# Patient Record
Sex: Male | Born: 1955 | Race: White | Hispanic: No | Marital: Married | Smoking: Current every day smoker
Health system: Southern US, Community
[De-identification: ages and names within clinical notes are randomized; demographics above are authoritative.]

## PROBLEM LIST (undated history)

## (undated) DIAGNOSIS — I739 Peripheral vascular disease, unspecified: Secondary | ICD-10-CM

## (undated) DIAGNOSIS — I2510 Atherosclerotic heart disease of native coronary artery without angina pectoris: Secondary | ICD-10-CM

## (undated) DIAGNOSIS — I6381 Other cerebral infarction due to occlusion or stenosis of small artery: Secondary | ICD-10-CM

## (undated) DIAGNOSIS — I700 Atherosclerosis of aorta: Secondary | ICD-10-CM

## (undated) HISTORY — PX: LUMBAR LAMINECTOMY: SHX95

## (undated) HISTORY — DX: Atherosclerotic heart disease of native coronary artery without angina pectoris: I25.10

## (undated) HISTORY — DX: Atherosclerosis of aorta: I70.0

## (undated) HISTORY — DX: Peripheral vascular disease, unspecified: I73.9

## (undated) HISTORY — DX: Other cerebral infarction due to occlusion or stenosis of small artery: I63.81

## (undated) HISTORY — PX: KNEE ARTHROSCOPY W/ MENISCAL REPAIR: SHX1877

## (undated) HISTORY — PX: TONSILLECTOMY AND ADENOIDECTOMY: SUR1326

---

## 1998-10-29 ENCOUNTER — Inpatient Hospital Stay (HOSPITAL_COMMUNITY): Admission: EM | Admit: 1998-10-29 | Discharge: 1998-10-30 | Payer: Self-pay | Admitting: Cardiology

## 2002-08-09 HISTORY — PX: ANAL FISSURECTOMY: SUR608

## 2004-06-09 ENCOUNTER — Ambulatory Visit: Payer: Self-pay | Admitting: Family Medicine

## 2004-10-20 ENCOUNTER — Ambulatory Visit: Payer: Self-pay | Admitting: Family Medicine

## 2005-05-03 ENCOUNTER — Ambulatory Visit: Payer: Self-pay | Admitting: Family Medicine

## 2012-08-09 HISTORY — PX: CERVICAL FUSION: SHX112

## 2013-09-14 ENCOUNTER — Other Ambulatory Visit: Payer: Self-pay | Admitting: Neurosurgery

## 2013-09-14 DIAGNOSIS — M5126 Other intervertebral disc displacement, lumbar region: Secondary | ICD-10-CM

## 2013-09-18 ENCOUNTER — Ambulatory Visit
Admission: RE | Admit: 2013-09-18 | Discharge: 2013-09-18 | Disposition: A | Discharge status: Home / Self Care | Payer: BC Managed Care – PPO | Source: Ambulatory Visit | Attending: Neurosurgery | Admitting: Neurosurgery

## 2013-09-18 VITALS — BP 118/68 | HR 73

## 2013-09-18 DIAGNOSIS — M5126 Other intervertebral disc displacement, lumbar region: Secondary | ICD-10-CM

## 2013-09-18 MED ORDER — ONDANSETRON HCL 4 MG/2ML IJ SOLN: 4.0000 mg | Freq: Four times a day (QID) | INTRAMUSCULAR | Status: DC | PRN

## 2013-09-18 MED ORDER — DIAZEPAM 5 MG PO TABS
10.0000 mg | ORAL_TABLET | Freq: Once | ORAL | Status: DC
Start: 2013-09-18 — End: 2013-09-20

## 2013-09-18 MED ORDER — IOHEXOL 180 MG/ML  SOLN
15.0000 mL | Freq: Once | INTRAMUSCULAR | Status: AC | PRN
  Administered 2013-09-18: 15 mL via INTRATHECAL

## 2013-09-18 MED ORDER — DIAZEPAM 5 MG PO TABS
10.0000 mg | ORAL_TABLET | Freq: Once | ORAL | Status: AC
  Administered 2013-09-18: 10 mg via ORAL

## 2013-09-18 NOTE — Discharge Instructions (Addendum)
Myelogram Discharge Instructions  1. Go home and rest quietly for the next 24 hours.  It is important to lie flat for the next 24 hours.  Get up only to go to the restroom.  You may lie in the bed or on a couch on your back, your stomach, your left side or your right side.  You may have one pillow under your head.  You may have pillows between your knees while you are on your side or under your knees while you are on your back.  2. DO NOT drive today.  Recline the seat as far back as it will go, while still wearing your seat belt, on the way home.  3. You may get up to go to the bathroom as needed.  You may sit up for 10 minutes to eat.  You may resume your normal diet and medications unless otherwise indicated.  Drink lots of extra fluids today and tomorrow.  4. The incidence of headache, nausea, or vomiting is about 5% (one in 20 patients).  If you develop a headache, lie flat and drink plenty of fluids until the headache goes away.  Caffeinated beverages may be helpful.  If you develop severe nausea and vomiting or a headache that does not go away with flat bed rest, call [phone].  5. You may resume normal activities after your 24 hours of bed rest is over; however, do not exert yourself strongly or do any heavy lifting tomorrow. If when you get up you have a headache when standing, go back to bed and force fluids for another 24 hours.  6. Call your physician for a follow-up appointment.  The results of your myelogram will be sent directly to your physician by the following day.  7. If you have any questions or if complications develop after you arrive home, please call [phone].  Discharge instructions have been explained to the patient.  The patient, or the person responsible for the patient, fully understands these instructions.      May resume Paroxetine on Feb. 11, 2015, after 1:00 pm.

## 2013-09-18 NOTE — Progress Notes (Signed)
Pt states he has been off paroxetine for the past 2 days. Discharge instructions explained to pt.

## 2013-09-20 ENCOUNTER — Telehealth: Payer: Self-pay | Admitting: Radiology

## 2013-09-20 NOTE — Telephone Encounter (Signed)
Pt had myelo on 09/18/13, pt now has a severe positional headache. Explained blood patch and need for more bedrest. He will call back in a.m. if needs the patch. I will call dr.'s off and see about getting a blood patch order.

## 2013-09-20 NOTE — Telephone Encounter (Signed)
Called dr. De Blanch office and was left on hold for 8 minutes waiting to see about blood patch order, will try again in morning if pt wishes to pursue this.

## 2015-04-28 ENCOUNTER — Other Ambulatory Visit: Payer: Self-pay | Admitting: Orthopaedic Surgery

## 2015-04-28 DIAGNOSIS — M4716 Other spondylosis with myelopathy, lumbar region: Secondary | ICD-10-CM

## 2015-04-29 ENCOUNTER — Ambulatory Visit
Admission: RE | Admit: 2015-04-29 | Discharge: 2015-04-29 | Disposition: A | Discharge status: Home / Self Care | Payer: Self-pay | Source: Ambulatory Visit | Attending: Orthopaedic Surgery | Admitting: Orthopaedic Surgery

## 2015-04-29 DIAGNOSIS — M4716 Other spondylosis with myelopathy, lumbar region: Secondary | ICD-10-CM

## 2015-08-12 DIAGNOSIS — M4806 Spinal stenosis, lumbar region: Secondary | ICD-10-CM | POA: Diagnosis not present

## 2015-08-12 DIAGNOSIS — M961 Postlaminectomy syndrome, not elsewhere classified: Secondary | ICD-10-CM | POA: Diagnosis not present

## 2015-08-12 DIAGNOSIS — M4316 Spondylolisthesis, lumbar region: Secondary | ICD-10-CM | POA: Diagnosis not present

## 2015-08-14 DIAGNOSIS — Z885 Allergy status to narcotic agent status: Secondary | ICD-10-CM | POA: Diagnosis not present

## 2015-08-14 DIAGNOSIS — M5440 Lumbago with sciatica, unspecified side: Secondary | ICD-10-CM | POA: Diagnosis not present

## 2015-08-14 DIAGNOSIS — Z880 Allergy status to penicillin: Secondary | ICD-10-CM | POA: Diagnosis not present

## 2015-08-14 DIAGNOSIS — Z79899 Other long term (current) drug therapy: Secondary | ICD-10-CM | POA: Diagnosis not present

## 2015-08-14 DIAGNOSIS — G8929 Other chronic pain: Secondary | ICD-10-CM | POA: Diagnosis not present

## 2015-08-14 DIAGNOSIS — F329 Major depressive disorder, single episode, unspecified: Secondary | ICD-10-CM | POA: Diagnosis not present

## 2015-09-08 DIAGNOSIS — M4316 Spondylolisthesis, lumbar region: Secondary | ICD-10-CM | POA: Diagnosis not present

## 2015-09-08 DIAGNOSIS — M4806 Spinal stenosis, lumbar region: Secondary | ICD-10-CM | POA: Diagnosis not present

## 2015-09-08 DIAGNOSIS — M5106 Intervertebral disc disorders with myelopathy, lumbar region: Secondary | ICD-10-CM | POA: Diagnosis not present

## 2015-09-12 DIAGNOSIS — Z720 Tobacco use: Secondary | ICD-10-CM | POA: Diagnosis not present

## 2015-09-12 DIAGNOSIS — Z716 Tobacco abuse counseling: Secondary | ICD-10-CM | POA: Diagnosis not present

## 2015-09-17 DIAGNOSIS — M25512 Pain in left shoulder: Secondary | ICD-10-CM | POA: Diagnosis not present

## 2015-09-17 DIAGNOSIS — M7542 Impingement syndrome of left shoulder: Secondary | ICD-10-CM | POA: Diagnosis not present

## 2015-09-24 DIAGNOSIS — M5136 Other intervertebral disc degeneration, lumbar region: Secondary | ICD-10-CM | POA: Diagnosis not present

## 2015-09-24 DIAGNOSIS — M4716 Other spondylosis with myelopathy, lumbar region: Secondary | ICD-10-CM | POA: Diagnosis not present

## 2015-10-09 DIAGNOSIS — M5106 Intervertebral disc disorders with myelopathy, lumbar region: Secondary | ICD-10-CM | POA: Diagnosis not present

## 2015-10-09 DIAGNOSIS — M4716 Other spondylosis with myelopathy, lumbar region: Secondary | ICD-10-CM | POA: Diagnosis not present

## 2015-10-16 DIAGNOSIS — M4806 Spinal stenosis, lumbar region: Secondary | ICD-10-CM | POA: Diagnosis not present

## 2015-10-16 DIAGNOSIS — M4716 Other spondylosis with myelopathy, lumbar region: Secondary | ICD-10-CM | POA: Diagnosis not present

## 2015-10-16 DIAGNOSIS — M5126 Other intervertebral disc displacement, lumbar region: Secondary | ICD-10-CM | POA: Diagnosis not present

## 2015-10-31 DIAGNOSIS — M4716 Other spondylosis with myelopathy, lumbar region: Secondary | ICD-10-CM | POA: Diagnosis not present

## 2015-10-31 DIAGNOSIS — M5106 Intervertebral disc disorders with myelopathy, lumbar region: Secondary | ICD-10-CM | POA: Diagnosis not present

## 2015-12-05 DIAGNOSIS — Z01812 Encounter for preprocedural laboratory examination: Secondary | ICD-10-CM | POA: Diagnosis not present

## 2015-12-08 DIAGNOSIS — Z880 Allergy status to penicillin: Secondary | ICD-10-CM | POA: Diagnosis not present

## 2015-12-08 DIAGNOSIS — Z885 Allergy status to narcotic agent status: Secondary | ICD-10-CM | POA: Diagnosis not present

## 2015-12-08 DIAGNOSIS — F17210 Nicotine dependence, cigarettes, uncomplicated: Secondary | ICD-10-CM | POA: Diagnosis not present

## 2015-12-08 DIAGNOSIS — M545 Low back pain: Secondary | ICD-10-CM | POA: Diagnosis not present

## 2015-12-08 DIAGNOSIS — M4316 Spondylolisthesis, lumbar region: Secondary | ICD-10-CM | POA: Diagnosis not present

## 2015-12-08 DIAGNOSIS — M4806 Spinal stenosis, lumbar region: Secondary | ICD-10-CM | POA: Diagnosis not present

## 2015-12-08 DIAGNOSIS — M4716 Other spondylosis with myelopathy, lumbar region: Secondary | ICD-10-CM | POA: Diagnosis not present

## 2015-12-08 DIAGNOSIS — M961 Postlaminectomy syndrome, not elsewhere classified: Secondary | ICD-10-CM | POA: Diagnosis not present

## 2015-12-08 DIAGNOSIS — M4322 Fusion of spine, cervical region: Secondary | ICD-10-CM | POA: Diagnosis not present

## 2015-12-08 DIAGNOSIS — M4726 Other spondylosis with radiculopathy, lumbar region: Secondary | ICD-10-CM | POA: Diagnosis not present

## 2015-12-10 DIAGNOSIS — M4326 Fusion of spine, lumbar region: Secondary | ICD-10-CM | POA: Diagnosis not present

## 2016-01-07 DIAGNOSIS — M4326 Fusion of spine, lumbar region: Secondary | ICD-10-CM | POA: Diagnosis not present

## 2016-01-26 DIAGNOSIS — J432 Centrilobular emphysema: Secondary | ICD-10-CM | POA: Insufficient documentation

## 2016-01-26 DIAGNOSIS — F3340 Major depressive disorder, recurrent, in remission, unspecified: Secondary | ICD-10-CM | POA: Insufficient documentation

## 2016-01-26 DIAGNOSIS — E782 Mixed hyperlipidemia: Secondary | ICD-10-CM | POA: Insufficient documentation

## 2016-01-28 DIAGNOSIS — Z13220 Encounter for screening for lipoid disorders: Secondary | ICD-10-CM | POA: Diagnosis not present

## 2016-01-28 DIAGNOSIS — Z298 Encounter for other specified prophylactic measures: Secondary | ICD-10-CM | POA: Diagnosis not present

## 2016-01-28 DIAGNOSIS — Z125 Encounter for screening for malignant neoplasm of prostate: Secondary | ICD-10-CM | POA: Diagnosis not present

## 2016-01-28 DIAGNOSIS — Z131 Encounter for screening for diabetes mellitus: Secondary | ICD-10-CM | POA: Diagnosis not present

## 2016-01-28 DIAGNOSIS — R634 Abnormal weight loss: Secondary | ICD-10-CM | POA: Diagnosis not present

## 2016-02-23 DIAGNOSIS — Z298 Encounter for other specified prophylactic measures: Secondary | ICD-10-CM | POA: Diagnosis not present

## 2016-03-03 DIAGNOSIS — M549 Dorsalgia, unspecified: Secondary | ICD-10-CM | POA: Diagnosis not present

## 2016-04-05 DIAGNOSIS — M4326 Fusion of spine, lumbar region: Secondary | ICD-10-CM | POA: Diagnosis not present

## 2016-04-05 DIAGNOSIS — M47816 Spondylosis without myelopathy or radiculopathy, lumbar region: Secondary | ICD-10-CM | POA: Diagnosis not present

## 2016-04-05 DIAGNOSIS — Z79891 Long term (current) use of opiate analgesic: Secondary | ICD-10-CM | POA: Diagnosis not present

## 2016-04-05 DIAGNOSIS — M5136 Other intervertebral disc degeneration, lumbar region: Secondary | ICD-10-CM | POA: Diagnosis not present

## 2016-04-05 DIAGNOSIS — G894 Chronic pain syndrome: Secondary | ICD-10-CM | POA: Diagnosis not present

## 2016-04-06 DIAGNOSIS — F17200 Nicotine dependence, unspecified, uncomplicated: Secondary | ICD-10-CM | POA: Diagnosis not present

## 2016-04-06 DIAGNOSIS — Z8371 Family history of colonic polyps: Secondary | ICD-10-CM | POA: Diagnosis not present

## 2016-04-06 DIAGNOSIS — Z1211 Encounter for screening for malignant neoplasm of colon: Secondary | ICD-10-CM | POA: Diagnosis not present

## 2016-04-16 DIAGNOSIS — F3340 Major depressive disorder, recurrent, in remission, unspecified: Secondary | ICD-10-CM | POA: Diagnosis not present

## 2016-04-16 DIAGNOSIS — N529 Male erectile dysfunction, unspecified: Secondary | ICD-10-CM | POA: Diagnosis not present

## 2016-04-16 DIAGNOSIS — F17200 Nicotine dependence, unspecified, uncomplicated: Secondary | ICD-10-CM | POA: Insufficient documentation

## 2016-04-21 DIAGNOSIS — D125 Benign neoplasm of sigmoid colon: Secondary | ICD-10-CM | POA: Diagnosis not present

## 2016-04-21 DIAGNOSIS — D122 Benign neoplasm of ascending colon: Secondary | ICD-10-CM | POA: Diagnosis not present

## 2016-04-21 DIAGNOSIS — Z800 Family history of malignant neoplasm of digestive organs: Secondary | ICD-10-CM | POA: Diagnosis not present

## 2016-04-21 DIAGNOSIS — G43909 Migraine, unspecified, not intractable, without status migrainosus: Secondary | ICD-10-CM | POA: Diagnosis not present

## 2016-04-21 DIAGNOSIS — K5730 Diverticulosis of large intestine without perforation or abscess without bleeding: Secondary | ICD-10-CM | POA: Diagnosis not present

## 2016-04-21 DIAGNOSIS — Z79899 Other long term (current) drug therapy: Secondary | ICD-10-CM | POA: Diagnosis not present

## 2016-04-21 DIAGNOSIS — K635 Polyp of colon: Secondary | ICD-10-CM | POA: Diagnosis not present

## 2016-04-21 DIAGNOSIS — F17210 Nicotine dependence, cigarettes, uncomplicated: Secondary | ICD-10-CM | POA: Diagnosis not present

## 2016-04-21 DIAGNOSIS — F3340 Major depressive disorder, recurrent, in remission, unspecified: Secondary | ICD-10-CM | POA: Diagnosis not present

## 2016-04-21 DIAGNOSIS — J449 Chronic obstructive pulmonary disease, unspecified: Secondary | ICD-10-CM | POA: Diagnosis not present

## 2016-04-21 DIAGNOSIS — D123 Benign neoplasm of transverse colon: Secondary | ICD-10-CM | POA: Diagnosis not present

## 2016-04-21 DIAGNOSIS — K648 Other hemorrhoids: Secondary | ICD-10-CM | POA: Diagnosis not present

## 2016-04-21 DIAGNOSIS — K219 Gastro-esophageal reflux disease without esophagitis: Secondary | ICD-10-CM | POA: Diagnosis not present

## 2016-04-21 DIAGNOSIS — D124 Benign neoplasm of descending colon: Secondary | ICD-10-CM | POA: Diagnosis not present

## 2016-04-21 DIAGNOSIS — Z8371 Family history of colonic polyps: Secondary | ICD-10-CM | POA: Diagnosis not present

## 2016-04-21 DIAGNOSIS — Z1211 Encounter for screening for malignant neoplasm of colon: Secondary | ICD-10-CM | POA: Diagnosis not present

## 2016-05-03 DIAGNOSIS — M4326 Fusion of spine, lumbar region: Secondary | ICD-10-CM | POA: Diagnosis not present

## 2016-05-03 DIAGNOSIS — M961 Postlaminectomy syndrome, not elsewhere classified: Secondary | ICD-10-CM | POA: Diagnosis not present

## 2016-06-09 DIAGNOSIS — J018 Other acute sinusitis: Secondary | ICD-10-CM | POA: Diagnosis not present

## 2016-06-09 DIAGNOSIS — J029 Acute pharyngitis, unspecified: Secondary | ICD-10-CM | POA: Diagnosis not present

## 2016-06-16 DIAGNOSIS — M4326 Fusion of spine, lumbar region: Secondary | ICD-10-CM | POA: Diagnosis not present

## 2016-06-16 DIAGNOSIS — M5106 Intervertebral disc disorders with myelopathy, lumbar region: Secondary | ICD-10-CM | POA: Diagnosis not present

## 2016-06-16 DIAGNOSIS — M4716 Other spondylosis with myelopathy, lumbar region: Secondary | ICD-10-CM | POA: Diagnosis not present

## 2016-07-30 DIAGNOSIS — J0100 Acute maxillary sinusitis, unspecified: Secondary | ICD-10-CM | POA: Diagnosis not present

## 2016-07-30 DIAGNOSIS — H6502 Acute serous otitis media, left ear: Secondary | ICD-10-CM | POA: Diagnosis not present

## 2016-11-15 DIAGNOSIS — Z79899 Other long term (current) drug therapy: Secondary | ICD-10-CM | POA: Diagnosis not present

## 2016-11-15 DIAGNOSIS — K635 Polyp of colon: Secondary | ICD-10-CM | POA: Diagnosis not present

## 2016-11-15 DIAGNOSIS — F17200 Nicotine dependence, unspecified, uncomplicated: Secondary | ICD-10-CM | POA: Diagnosis not present

## 2016-11-15 DIAGNOSIS — K5730 Diverticulosis of large intestine without perforation or abscess without bleeding: Secondary | ICD-10-CM | POA: Diagnosis not present

## 2016-11-15 DIAGNOSIS — Z86010 Personal history of colonic polyps: Secondary | ICD-10-CM | POA: Diagnosis not present

## 2016-11-15 DIAGNOSIS — Z8371 Family history of colonic polyps: Secondary | ICD-10-CM | POA: Diagnosis not present

## 2016-11-15 DIAGNOSIS — D125 Benign neoplasm of sigmoid colon: Secondary | ICD-10-CM | POA: Diagnosis not present

## 2016-11-15 DIAGNOSIS — K648 Other hemorrhoids: Secondary | ICD-10-CM | POA: Diagnosis not present

## 2016-11-15 DIAGNOSIS — D122 Benign neoplasm of ascending colon: Secondary | ICD-10-CM | POA: Diagnosis not present

## 2016-11-15 DIAGNOSIS — Z1211 Encounter for screening for malignant neoplasm of colon: Secondary | ICD-10-CM | POA: Diagnosis not present

## 2016-12-27 DIAGNOSIS — J018 Other acute sinusitis: Secondary | ICD-10-CM | POA: Diagnosis not present

## 2017-01-04 DIAGNOSIS — J018 Other acute sinusitis: Secondary | ICD-10-CM | POA: Diagnosis not present

## 2017-01-04 DIAGNOSIS — J42 Unspecified chronic bronchitis: Secondary | ICD-10-CM | POA: Diagnosis not present

## 2017-01-04 DIAGNOSIS — J209 Acute bronchitis, unspecified: Secondary | ICD-10-CM | POA: Diagnosis not present

## 2017-01-24 DIAGNOSIS — M7542 Impingement syndrome of left shoulder: Secondary | ICD-10-CM | POA: Diagnosis not present

## 2017-01-24 DIAGNOSIS — G5603 Carpal tunnel syndrome, bilateral upper limbs: Secondary | ICD-10-CM | POA: Diagnosis not present

## 2017-01-24 DIAGNOSIS — M25512 Pain in left shoulder: Secondary | ICD-10-CM | POA: Diagnosis not present

## 2017-01-26 DIAGNOSIS — G5603 Carpal tunnel syndrome, bilateral upper limbs: Secondary | ICD-10-CM | POA: Diagnosis not present

## 2017-04-24 IMAGING — MR MR LUMBAR SPINE W/O CM
5 series · 38 of 48 positions shown · non-contrast
Comparison: Lumbar MRI 07/18/2014, and earlier.

CLINICAL DATA: 58-year-old male with lumbar back pain radiating to
both hips. Lower extremity weakness. Symptoms increased for several
months. Previous surgery and steroid injection. Subsequent
encounter.

EXAM:
MRI LUMBAR SPINE WITHOUT CONTRAST
TECHNIQUE: Multiplanar, multisequence MR imaging of the lumbar spine was
performed. No intravenous contrast was administered.

[Series 3: tirm sag · sagittal · 4.0mm · 0.55mm/px · 7 of 15 slices shown]
[im 1/15]
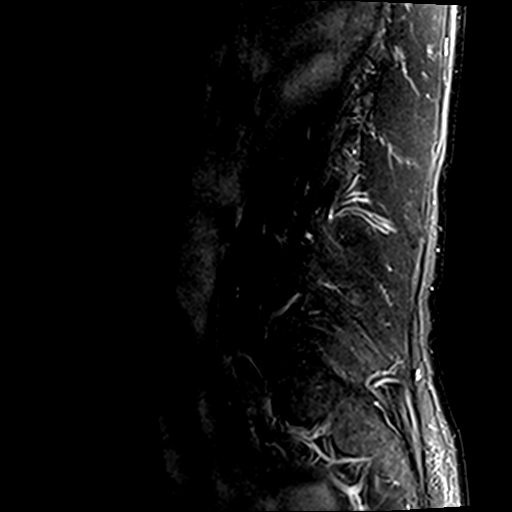
[im 3/15]
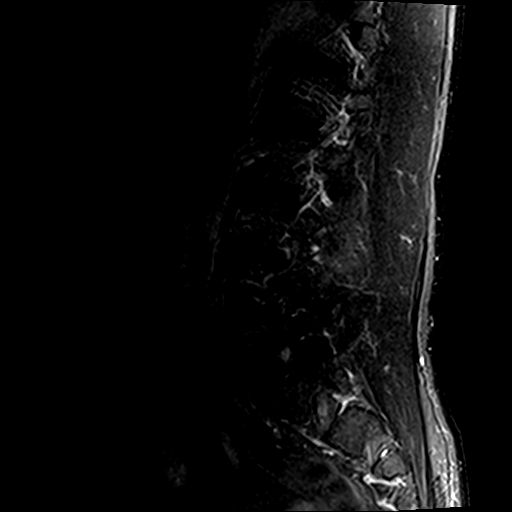
[im 5/15]
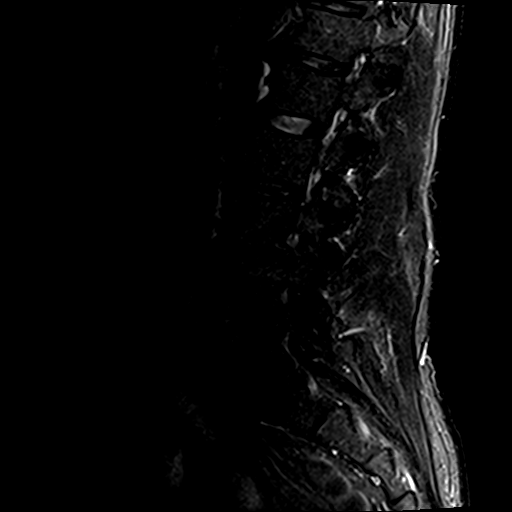
[im 8/15]
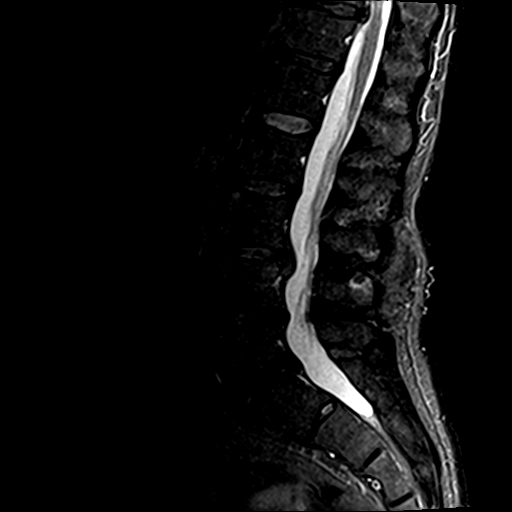
[im 10/15]
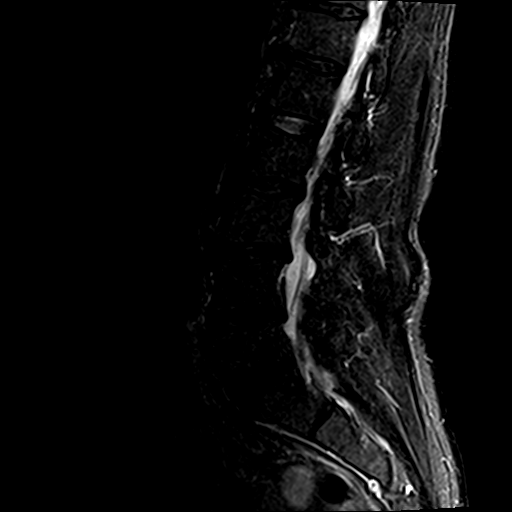
[im 12/15]
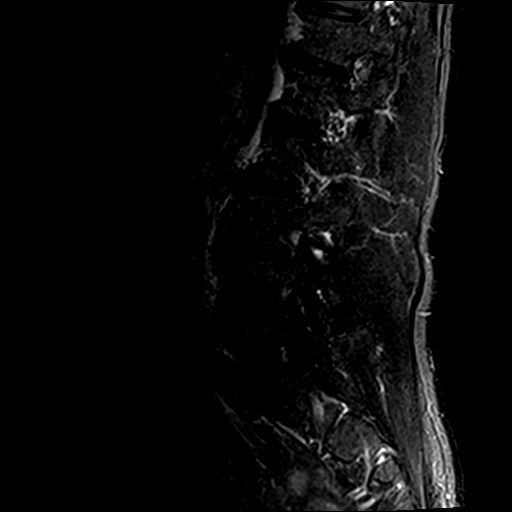
[im 15/15]
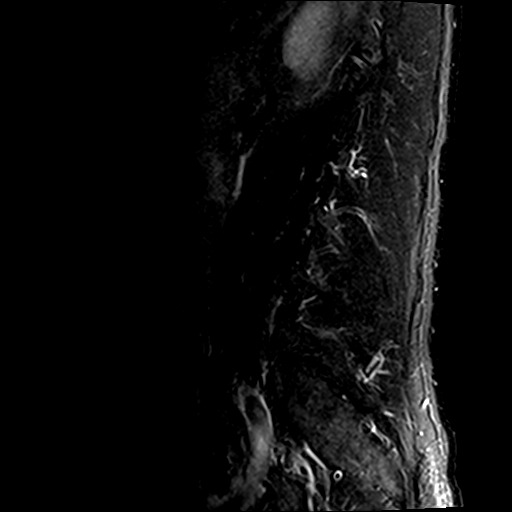

[Series 4: T2 · sagittal · 4.0mm · 0.88mm/px · 7 of 15 slices shown (1 of 2)]
[im 1/15]
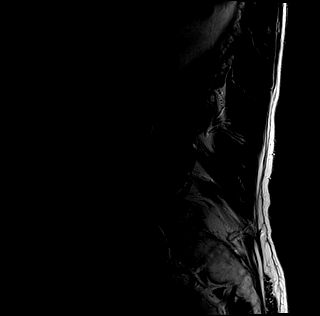
[im 3/15]
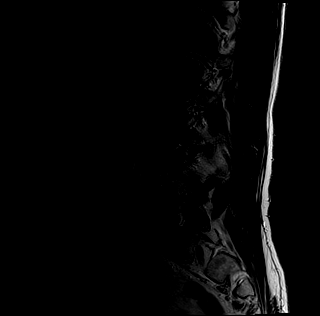
[im 5/15]
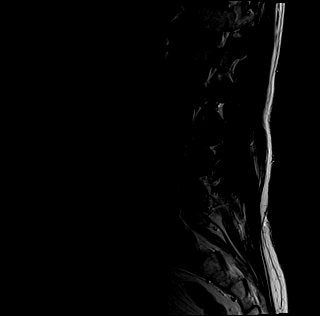
[im 8/15]
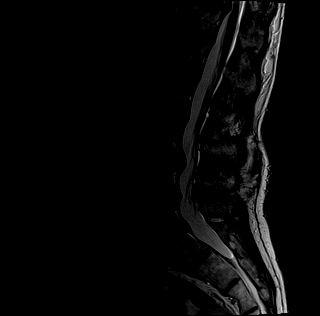
[im 10/15]
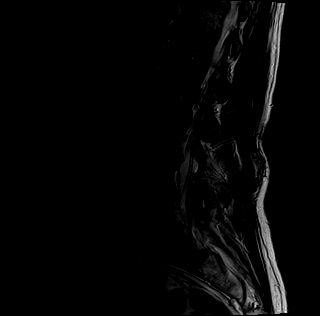
[im 12/15]
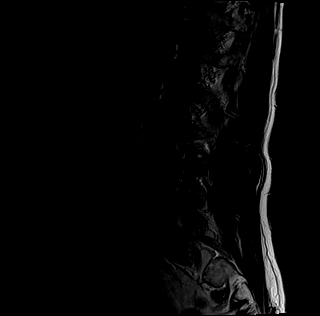
[im 15/15]
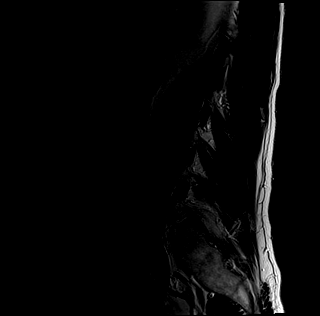

[Series 5: T1 · sagittal · 4.0mm · 0.88mm/px · 6 of 15 slices shown (1 of 2)]
[im 1/15]
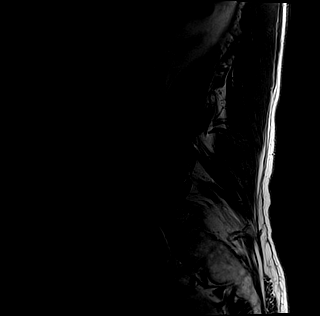
[im 3/15]
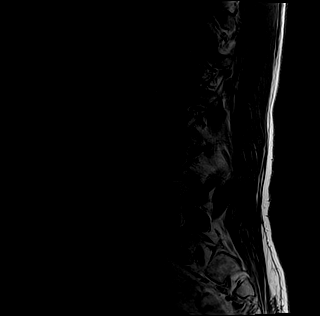
[im 6/15]
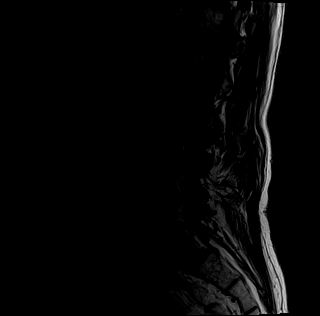
[im 9/15]
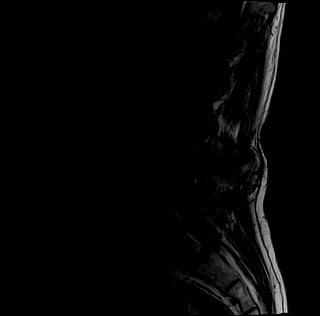
[im 12/15]
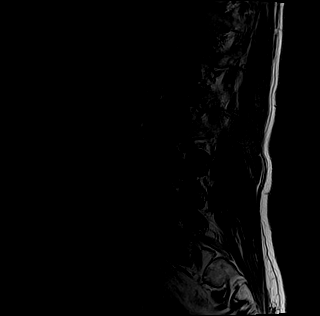
[im 15/15]
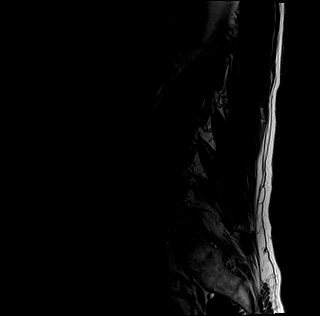

[Series 6: T1 · axial · 4.0mm · 0.70mm/px · z∈[-51,+135]mm · 8 of 34 slices shown (2 of 2)]
[im 1/34]
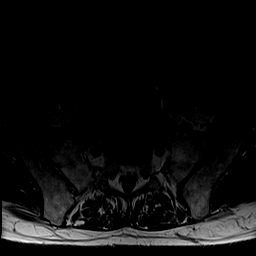
[im 6/34]
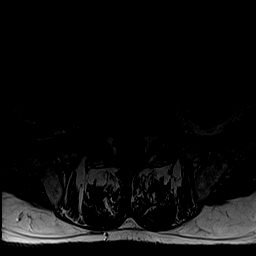
[im 11/34]
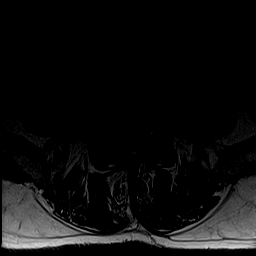
[im 16/34]
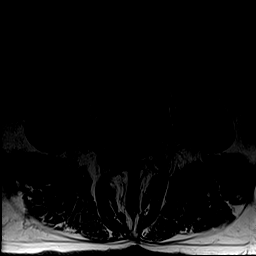
[im 18/34]
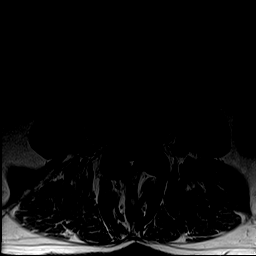
[im 23/34]
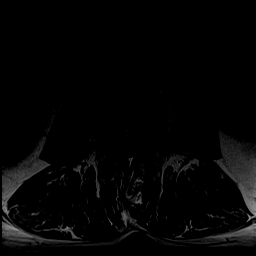
[im 28/34]
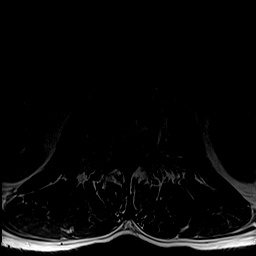
[im 34/34]
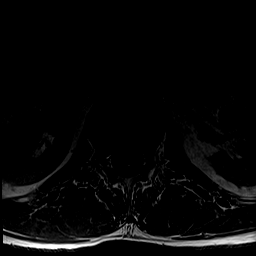

[Series 7: T2 · axial · 4.0mm · 0.70mm/px · z∈[-51,+135]mm · 10 of 34 slices shown (2 of 2)]
[im 1/34]
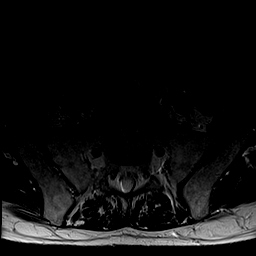
[im 3/34]
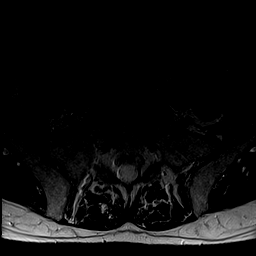
[im 6/34]
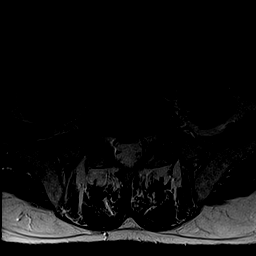
[im 8/34]
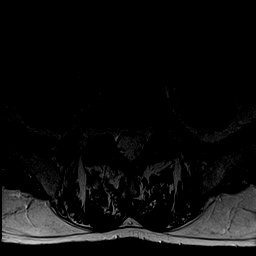
[im 11/34]
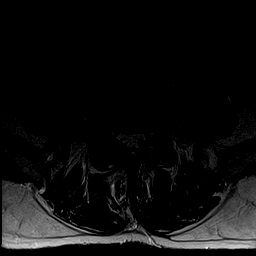
[im 16/34]
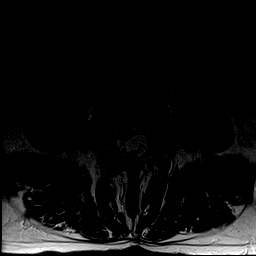
[im 18/34]
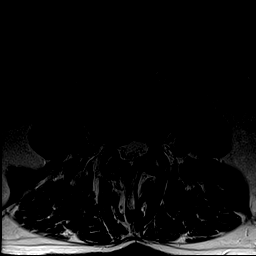
[im 23/34]
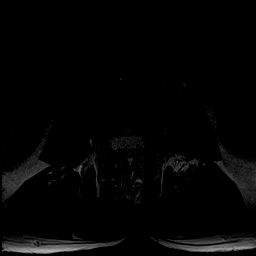
[im 28/34]
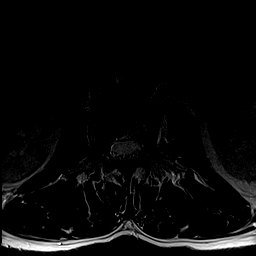
[im 34/34]
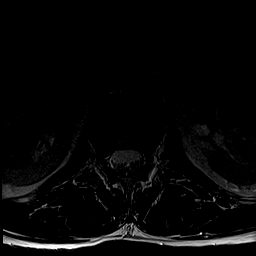

[38 of 48 positions shown; findings below may reference images not displayed]

FINDINGS: Same numbering system as on the comparison, designating normal
lumbar segmentation. Stable lumbar vertebral height and alignment.
No marrow edema or evidence of acute osseous abnormality.

No signal abnormality identified in the visualized lower thoracic
spinal cord. There is chronic disc disease at T11-T12 which indents
the ventral cord as before (series 4, image 8). The conus medullaris
appears normal at L1.

Ectasia of the infrarenal abdominal aorta, measuring up to 30 mm
diameter, stable. Stable visualized abdominal viscera. Stable
posterior paraspinal soft tissues.

T12-L1:  Stable disc desiccation and anterior eccentric disc bulge.

L1-L2:  Negative.

L2-L3: Stable mild disc desiccation and left eccentric disc bulge.
Stable mild facet hypertrophy. No spinal stenosis. No convincing
lateral recess or foraminal stenosis.

L3-L4: Postoperative changes to the lamina at this level. Chronic
circumferential disc bulge. New or increased right paracentral to
lateral recess caudal disc extrusion with caudal fragment measuring
12 mm diameter (series 7, image 18). Residual moderate facet
hypertrophy. Subsequent severe right lateral recess stenosis with
increased mass effect on the descending right L4 nerve roots. No
significant spinal stenosis overall. Mild to moderate L3 foraminal
stenosis greater on the right has not significantly changed.

L4-L5: Stable right eccentric circumferential disc bulge with
broad-based posterior component. Stable mild to moderate facet and
mild ligament flavum hypertrophy. Stable thecal sac without spinal
or convincing lateral recess stenosis. Mild right L4 foraminal
stenosis is stable.

L5-S1: Stable is slightly increased central disc protrusion (series
7, image 29). Disc material in proximity to the S1 nerve roots in
the lateral recesses, more so the right. Stable mild facet
hypertrophy. No spinal stenosis. No significant foraminal stenosis.
IMPRESSION: 1. Symptomatic abnormality favored to be new/progressed caudal disc
extrusion to the right of midline at L3-L4. Increased displacement
of the descending right L4 nerve roots. Postoperative changes at
that level, no significant spinal stenosis and mild to moderate L3
foraminal stenosis is stable.
2. Slightly increased central disc protrusion at L5-S1, in proximity
to the S1 nerve roots more so the right.
3. Other lumbar levels are stable, including mild right L4 foraminal
stenosis.

## 2017-05-12 DIAGNOSIS — F3340 Major depressive disorder, recurrent, in remission, unspecified: Secondary | ICD-10-CM | POA: Diagnosis not present

## 2017-07-04 DIAGNOSIS — Z0000 Encounter for general adult medical examination without abnormal findings: Secondary | ICD-10-CM | POA: Diagnosis not present

## 2017-07-04 DIAGNOSIS — Z1211 Encounter for screening for malignant neoplasm of colon: Secondary | ICD-10-CM | POA: Diagnosis not present

## 2017-07-04 DIAGNOSIS — Z125 Encounter for screening for malignant neoplasm of prostate: Secondary | ICD-10-CM | POA: Diagnosis not present

## 2017-08-09 HISTORY — PX: ELBOW SURGERY: SHX618

## 2017-11-23 DIAGNOSIS — S39012A Strain of muscle, fascia and tendon of lower back, initial encounter: Secondary | ICD-10-CM | POA: Diagnosis not present

## 2018-01-09 DIAGNOSIS — M25512 Pain in left shoulder: Secondary | ICD-10-CM | POA: Diagnosis not present

## 2018-01-09 DIAGNOSIS — G5622 Lesion of ulnar nerve, left upper limb: Secondary | ICD-10-CM | POA: Diagnosis not present

## 2018-01-11 DIAGNOSIS — M25512 Pain in left shoulder: Secondary | ICD-10-CM | POA: Diagnosis not present

## 2018-01-11 DIAGNOSIS — M19012 Primary osteoarthritis, left shoulder: Secondary | ICD-10-CM | POA: Diagnosis not present

## 2018-01-12 DIAGNOSIS — M25512 Pain in left shoulder: Secondary | ICD-10-CM | POA: Diagnosis not present

## 2018-01-12 DIAGNOSIS — M7542 Impingement syndrome of left shoulder: Secondary | ICD-10-CM | POA: Diagnosis not present

## 2018-01-12 DIAGNOSIS — G5622 Lesion of ulnar nerve, left upper limb: Secondary | ICD-10-CM | POA: Diagnosis not present

## 2018-01-16 DIAGNOSIS — M25512 Pain in left shoulder: Secondary | ICD-10-CM | POA: Diagnosis not present

## 2018-01-16 DIAGNOSIS — M25612 Stiffness of left shoulder, not elsewhere classified: Secondary | ICD-10-CM | POA: Diagnosis not present

## 2018-01-16 DIAGNOSIS — M6281 Muscle weakness (generalized): Secondary | ICD-10-CM | POA: Diagnosis not present

## 2018-01-16 DIAGNOSIS — M7542 Impingement syndrome of left shoulder: Secondary | ICD-10-CM | POA: Diagnosis not present

## 2018-01-24 DIAGNOSIS — G5622 Lesion of ulnar nerve, left upper limb: Secondary | ICD-10-CM | POA: Diagnosis not present

## 2018-01-24 DIAGNOSIS — K219 Gastro-esophageal reflux disease without esophagitis: Secondary | ICD-10-CM | POA: Diagnosis not present

## 2018-01-24 DIAGNOSIS — Z791 Long term (current) use of non-steroidal anti-inflammatories (NSAID): Secondary | ICD-10-CM | POA: Diagnosis not present

## 2018-01-24 DIAGNOSIS — F17210 Nicotine dependence, cigarettes, uncomplicated: Secondary | ICD-10-CM | POA: Diagnosis not present

## 2018-01-24 DIAGNOSIS — Z01818 Encounter for other preprocedural examination: Secondary | ICD-10-CM

## 2018-01-24 DIAGNOSIS — Z79899 Other long term (current) drug therapy: Secondary | ICD-10-CM | POA: Diagnosis not present

## 2018-01-24 DIAGNOSIS — M069 Rheumatoid arthritis, unspecified: Secondary | ICD-10-CM | POA: Diagnosis not present

## 2018-01-24 DIAGNOSIS — I4510 Unspecified right bundle-branch block: Secondary | ICD-10-CM | POA: Diagnosis not present

## 2018-01-24 DIAGNOSIS — R9431 Abnormal electrocardiogram [ECG] [EKG]: Secondary | ICD-10-CM | POA: Diagnosis not present

## 2018-01-24 DIAGNOSIS — Z01810 Encounter for preprocedural cardiovascular examination: Secondary | ICD-10-CM | POA: Diagnosis not present

## 2018-01-24 DIAGNOSIS — F418 Other specified anxiety disorders: Secondary | ICD-10-CM | POA: Diagnosis not present

## 2018-01-24 DIAGNOSIS — M1990 Unspecified osteoarthritis, unspecified site: Secondary | ICD-10-CM | POA: Diagnosis not present

## 2018-02-08 DIAGNOSIS — M6281 Muscle weakness (generalized): Secondary | ICD-10-CM | POA: Diagnosis not present

## 2018-02-08 DIAGNOSIS — M25512 Pain in left shoulder: Secondary | ICD-10-CM | POA: Diagnosis not present

## 2018-02-08 DIAGNOSIS — M25612 Stiffness of left shoulder, not elsewhere classified: Secondary | ICD-10-CM | POA: Diagnosis not present

## 2018-02-08 DIAGNOSIS — M7542 Impingement syndrome of left shoulder: Secondary | ICD-10-CM | POA: Diagnosis not present

## 2018-02-13 DIAGNOSIS — M25612 Stiffness of left shoulder, not elsewhere classified: Secondary | ICD-10-CM | POA: Diagnosis not present

## 2018-02-13 DIAGNOSIS — M25512 Pain in left shoulder: Secondary | ICD-10-CM | POA: Diagnosis not present

## 2018-02-13 DIAGNOSIS — M7542 Impingement syndrome of left shoulder: Secondary | ICD-10-CM | POA: Diagnosis not present

## 2018-02-13 DIAGNOSIS — M6281 Muscle weakness (generalized): Secondary | ICD-10-CM | POA: Diagnosis not present

## 2018-02-20 DIAGNOSIS — M25612 Stiffness of left shoulder, not elsewhere classified: Secondary | ICD-10-CM | POA: Diagnosis not present

## 2018-02-20 DIAGNOSIS — M25512 Pain in left shoulder: Secondary | ICD-10-CM | POA: Diagnosis not present

## 2018-02-20 DIAGNOSIS — M7542 Impingement syndrome of left shoulder: Secondary | ICD-10-CM | POA: Diagnosis not present

## 2018-02-20 DIAGNOSIS — M6281 Muscle weakness (generalized): Secondary | ICD-10-CM | POA: Diagnosis not present

## 2018-02-27 DIAGNOSIS — M25612 Stiffness of left shoulder, not elsewhere classified: Secondary | ICD-10-CM | POA: Diagnosis not present

## 2018-02-27 DIAGNOSIS — M6281 Muscle weakness (generalized): Secondary | ICD-10-CM | POA: Diagnosis not present

## 2018-02-27 DIAGNOSIS — M7542 Impingement syndrome of left shoulder: Secondary | ICD-10-CM | POA: Diagnosis not present

## 2018-02-27 DIAGNOSIS — M25512 Pain in left shoulder: Secondary | ICD-10-CM | POA: Diagnosis not present

## 2018-05-08 DIAGNOSIS — J302 Other seasonal allergic rhinitis: Secondary | ICD-10-CM | POA: Diagnosis not present

## 2018-05-08 DIAGNOSIS — J018 Other acute sinusitis: Secondary | ICD-10-CM | POA: Diagnosis not present

## 2019-04-13 DIAGNOSIS — M519 Unspecified thoracic, thoracolumbar and lumbosacral intervertebral disc disorder: Secondary | ICD-10-CM | POA: Diagnosis not present

## 2019-04-13 DIAGNOSIS — S39012A Strain of muscle, fascia and tendon of lower back, initial encounter: Secondary | ICD-10-CM | POA: Diagnosis not present

## 2019-05-21 DIAGNOSIS — Z20828 Contact with and (suspected) exposure to other viral communicable diseases: Secondary | ICD-10-CM | POA: Diagnosis not present

## 2019-05-21 DIAGNOSIS — J018 Other acute sinusitis: Secondary | ICD-10-CM | POA: Diagnosis not present

## 2019-12-31 DIAGNOSIS — Z125 Encounter for screening for malignant neoplasm of prostate: Secondary | ICD-10-CM | POA: Diagnosis not present

## 2019-12-31 DIAGNOSIS — F3340 Major depressive disorder, recurrent, in remission, unspecified: Secondary | ICD-10-CM | POA: Diagnosis not present

## 2019-12-31 DIAGNOSIS — Z13220 Encounter for screening for lipoid disorders: Secondary | ICD-10-CM | POA: Diagnosis not present

## 2019-12-31 DIAGNOSIS — Z131 Encounter for screening for diabetes mellitus: Secondary | ICD-10-CM | POA: Diagnosis not present

## 2019-12-31 DIAGNOSIS — Z0000 Encounter for general adult medical examination without abnormal findings: Secondary | ICD-10-CM | POA: Diagnosis not present

## 2019-12-31 DIAGNOSIS — F17210 Nicotine dependence, cigarettes, uncomplicated: Secondary | ICD-10-CM | POA: Diagnosis not present

## 2019-12-31 DIAGNOSIS — Z23 Encounter for immunization: Secondary | ICD-10-CM | POA: Diagnosis not present

## 2019-12-31 DIAGNOSIS — Z79899 Other long term (current) drug therapy: Secondary | ICD-10-CM | POA: Diagnosis not present

## 2020-03-11 DIAGNOSIS — S39012A Strain of muscle, fascia and tendon of lower back, initial encounter: Secondary | ICD-10-CM | POA: Diagnosis not present

## 2020-09-18 DIAGNOSIS — M519 Unspecified thoracic, thoracolumbar and lumbosacral intervertebral disc disorder: Secondary | ICD-10-CM | POA: Diagnosis not present

## 2020-09-18 DIAGNOSIS — H6502 Acute serous otitis media, left ear: Secondary | ICD-10-CM | POA: Diagnosis not present

## 2020-10-17 DIAGNOSIS — J441 Chronic obstructive pulmonary disease with (acute) exacerbation: Secondary | ICD-10-CM | POA: Diagnosis not present

## 2020-10-17 DIAGNOSIS — Z20822 Contact with and (suspected) exposure to covid-19: Secondary | ICD-10-CM | POA: Diagnosis not present

## 2020-12-12 DIAGNOSIS — M4326 Fusion of spine, lumbar region: Secondary | ICD-10-CM | POA: Diagnosis not present

## 2020-12-12 DIAGNOSIS — M4726 Other spondylosis with radiculopathy, lumbar region: Secondary | ICD-10-CM | POA: Diagnosis not present

## 2020-12-12 DIAGNOSIS — M545 Low back pain, unspecified: Secondary | ICD-10-CM | POA: Diagnosis not present

## 2020-12-12 DIAGNOSIS — M4716 Other spondylosis with myelopathy, lumbar region: Secondary | ICD-10-CM | POA: Diagnosis not present

## 2020-12-23 DIAGNOSIS — M545 Low back pain, unspecified: Secondary | ICD-10-CM | POA: Diagnosis not present

## 2020-12-23 DIAGNOSIS — M6281 Muscle weakness (generalized): Secondary | ICD-10-CM | POA: Diagnosis not present

## 2020-12-23 DIAGNOSIS — M2560 Stiffness of unspecified joint, not elsewhere classified: Secondary | ICD-10-CM | POA: Diagnosis not present

## 2020-12-23 DIAGNOSIS — R293 Abnormal posture: Secondary | ICD-10-CM | POA: Diagnosis not present

## 2020-12-25 DIAGNOSIS — R293 Abnormal posture: Secondary | ICD-10-CM | POA: Diagnosis not present

## 2020-12-25 DIAGNOSIS — M6281 Muscle weakness (generalized): Secondary | ICD-10-CM | POA: Diagnosis not present

## 2020-12-25 DIAGNOSIS — M545 Low back pain, unspecified: Secondary | ICD-10-CM | POA: Diagnosis not present

## 2020-12-25 DIAGNOSIS — M2560 Stiffness of unspecified joint, not elsewhere classified: Secondary | ICD-10-CM | POA: Diagnosis not present

## 2020-12-30 DIAGNOSIS — M6281 Muscle weakness (generalized): Secondary | ICD-10-CM | POA: Diagnosis not present

## 2020-12-30 DIAGNOSIS — M545 Low back pain, unspecified: Secondary | ICD-10-CM | POA: Diagnosis not present

## 2020-12-30 DIAGNOSIS — M2560 Stiffness of unspecified joint, not elsewhere classified: Secondary | ICD-10-CM | POA: Diagnosis not present

## 2020-12-30 DIAGNOSIS — R293 Abnormal posture: Secondary | ICD-10-CM | POA: Diagnosis not present

## 2020-12-31 DIAGNOSIS — M4326 Fusion of spine, lumbar region: Secondary | ICD-10-CM | POA: Diagnosis not present

## 2020-12-31 DIAGNOSIS — M545 Low back pain, unspecified: Secondary | ICD-10-CM | POA: Diagnosis not present

## 2021-01-06 DIAGNOSIS — M2560 Stiffness of unspecified joint, not elsewhere classified: Secondary | ICD-10-CM | POA: Diagnosis not present

## 2021-01-06 DIAGNOSIS — M545 Low back pain, unspecified: Secondary | ICD-10-CM | POA: Diagnosis not present

## 2021-01-06 DIAGNOSIS — M6281 Muscle weakness (generalized): Secondary | ICD-10-CM | POA: Diagnosis not present

## 2021-01-06 DIAGNOSIS — R293 Abnormal posture: Secondary | ICD-10-CM | POA: Diagnosis not present

## 2021-01-08 DIAGNOSIS — M545 Low back pain, unspecified: Secondary | ICD-10-CM | POA: Diagnosis not present

## 2021-01-08 DIAGNOSIS — M2560 Stiffness of unspecified joint, not elsewhere classified: Secondary | ICD-10-CM | POA: Diagnosis not present

## 2021-01-08 DIAGNOSIS — R293 Abnormal posture: Secondary | ICD-10-CM | POA: Diagnosis not present

## 2021-01-08 DIAGNOSIS — M6281 Muscle weakness (generalized): Secondary | ICD-10-CM | POA: Diagnosis not present

## 2021-01-15 DIAGNOSIS — M4712 Other spondylosis with myelopathy, cervical region: Secondary | ICD-10-CM | POA: Diagnosis not present

## 2021-01-15 DIAGNOSIS — M4726 Other spondylosis with radiculopathy, lumbar region: Secondary | ICD-10-CM | POA: Diagnosis not present

## 2021-01-15 DIAGNOSIS — M542 Cervicalgia: Secondary | ICD-10-CM | POA: Diagnosis not present

## 2021-01-15 DIAGNOSIS — M546 Pain in thoracic spine: Secondary | ICD-10-CM | POA: Diagnosis not present

## 2021-01-15 DIAGNOSIS — M4326 Fusion of spine, lumbar region: Secondary | ICD-10-CM | POA: Diagnosis not present

## 2021-01-15 DIAGNOSIS — M4714 Other spondylosis with myelopathy, thoracic region: Secondary | ICD-10-CM | POA: Diagnosis not present

## 2021-01-26 DIAGNOSIS — M2560 Stiffness of unspecified joint, not elsewhere classified: Secondary | ICD-10-CM | POA: Diagnosis not present

## 2021-01-26 DIAGNOSIS — M545 Low back pain, unspecified: Secondary | ICD-10-CM | POA: Diagnosis not present

## 2021-01-26 DIAGNOSIS — R293 Abnormal posture: Secondary | ICD-10-CM | POA: Diagnosis not present

## 2021-01-26 DIAGNOSIS — M6281 Muscle weakness (generalized): Secondary | ICD-10-CM | POA: Diagnosis not present

## 2021-01-29 DIAGNOSIS — M4712 Other spondylosis with myelopathy, cervical region: Secondary | ICD-10-CM | POA: Diagnosis not present

## 2021-01-29 DIAGNOSIS — M4714 Other spondylosis with myelopathy, thoracic region: Secondary | ICD-10-CM | POA: Diagnosis not present

## 2021-01-29 DIAGNOSIS — M4322 Fusion of spine, cervical region: Secondary | ICD-10-CM | POA: Diagnosis not present

## 2021-01-29 DIAGNOSIS — M5021 Other cervical disc displacement,  high cervical region: Secondary | ICD-10-CM | POA: Diagnosis not present

## 2021-01-29 DIAGNOSIS — M47812 Spondylosis without myelopathy or radiculopathy, cervical region: Secondary | ICD-10-CM | POA: Diagnosis not present

## 2021-01-29 DIAGNOSIS — Z981 Arthrodesis status: Secondary | ICD-10-CM | POA: Diagnosis not present

## 2021-01-29 DIAGNOSIS — M4312 Spondylolisthesis, cervical region: Secondary | ICD-10-CM | POA: Diagnosis not present

## 2021-01-29 DIAGNOSIS — M4802 Spinal stenosis, cervical region: Secondary | ICD-10-CM | POA: Diagnosis not present

## 2021-01-29 DIAGNOSIS — M5124 Other intervertebral disc displacement, thoracic region: Secondary | ICD-10-CM | POA: Diagnosis not present

## 2021-01-29 DIAGNOSIS — M47814 Spondylosis without myelopathy or radiculopathy, thoracic region: Secondary | ICD-10-CM | POA: Diagnosis not present

## 2021-02-02 DIAGNOSIS — M2560 Stiffness of unspecified joint, not elsewhere classified: Secondary | ICD-10-CM | POA: Diagnosis not present

## 2021-02-02 DIAGNOSIS — M6281 Muscle weakness (generalized): Secondary | ICD-10-CM | POA: Diagnosis not present

## 2021-02-02 DIAGNOSIS — M545 Low back pain, unspecified: Secondary | ICD-10-CM | POA: Diagnosis not present

## 2021-02-02 DIAGNOSIS — R293 Abnormal posture: Secondary | ICD-10-CM | POA: Diagnosis not present

## 2021-02-11 DIAGNOSIS — M4726 Other spondylosis with radiculopathy, lumbar region: Secondary | ICD-10-CM | POA: Diagnosis not present

## 2021-02-11 DIAGNOSIS — M4326 Fusion of spine, lumbar region: Secondary | ICD-10-CM | POA: Diagnosis not present

## 2021-07-03 DIAGNOSIS — S61211A Laceration without foreign body of left index finger without damage to nail, initial encounter: Secondary | ICD-10-CM | POA: Diagnosis not present

## 2021-08-25 DIAGNOSIS — I10 Essential (primary) hypertension: Secondary | ICD-10-CM | POA: Insufficient documentation

## 2024-03-09 ENCOUNTER — Other Ambulatory Visit: Payer: Self-pay

## 2024-03-09 DIAGNOSIS — I700 Atherosclerosis of aorta: Secondary | ICD-10-CM | POA: Insufficient documentation

## 2024-03-09 DIAGNOSIS — I739 Peripheral vascular disease, unspecified: Secondary | ICD-10-CM | POA: Insufficient documentation

## 2024-03-09 DIAGNOSIS — I6381 Other cerebral infarction due to occlusion or stenosis of small artery: Secondary | ICD-10-CM | POA: Insufficient documentation

## 2024-03-09 DIAGNOSIS — I2510 Atherosclerotic heart disease of native coronary artery without angina pectoris: Secondary | ICD-10-CM | POA: Insufficient documentation

## 2024-03-20 ENCOUNTER — Ambulatory Visit: Payer: Self-pay | Attending: Cardiology | Admitting: Cardiology

## 2024-03-20 ENCOUNTER — Encounter: Payer: Self-pay | Admitting: Cardiology

## 2024-03-20 VITALS — BP 150/96 | HR 92 | Ht 71.0 in | Wt 168.0 lb

## 2024-03-20 DIAGNOSIS — I700 Atherosclerosis of aorta: Secondary | ICD-10-CM

## 2024-03-20 DIAGNOSIS — I10 Essential (primary) hypertension: Secondary | ICD-10-CM | POA: Diagnosis not present

## 2024-03-20 DIAGNOSIS — E782 Mixed hyperlipidemia: Secondary | ICD-10-CM

## 2024-03-20 DIAGNOSIS — I351 Nonrheumatic aortic (valve) insufficiency: Secondary | ICD-10-CM | POA: Insufficient documentation

## 2024-03-20 DIAGNOSIS — J432 Centrilobular emphysema: Secondary | ICD-10-CM

## 2024-03-20 DIAGNOSIS — I2510 Atherosclerotic heart disease of native coronary artery without angina pectoris: Secondary | ICD-10-CM | POA: Diagnosis not present

## 2024-03-20 DIAGNOSIS — R072 Precordial pain: Secondary | ICD-10-CM

## 2024-03-20 DIAGNOSIS — F17200 Nicotine dependence, unspecified, uncomplicated: Secondary | ICD-10-CM

## 2024-03-20 DIAGNOSIS — I739 Peripheral vascular disease, unspecified: Secondary | ICD-10-CM

## 2024-03-20 DIAGNOSIS — I6381 Other cerebral infarction due to occlusion or stenosis of small artery: Secondary | ICD-10-CM

## 2024-03-20 MED ORDER — METOPROLOL TARTRATE 100 MG PO TABS: ORAL_TABLET | ORAL | 0 refills | Status: AC

## 2024-03-20 NOTE — Progress Notes (Signed)
 Cardiology Consultation:    Date:  03/20/2024   ID:  Shane Mcguire, DOB 1956-07-08, MRN [id number]  PCP:  Patient, No Pcp Per  Cardiologist:  Shane Fitch, MD   Referring MD: Shane Shane LITTIE, MD   No chief complaint on file.   History of Present Illness:    Shane Mcguire is a 68 y.o. male who is being seen today for the evaluation of  heart murmur.  At the request of Dough, Shane LITTIE, MD. past medical history significant for dyslipidemia, excellently managed by his primary care physician, essential hypertension, calcification of the coronary arteries, smoking which is still ongoing.  He was sent to me for evaluation of his heart murmur.  On top of that he is scheduled to have surgery on his back which will be fusion of 3 vertebrae which will be at the end of September.  He never had any heart trouble but he knew about the fact that he had heart murmur.  Ech[facility]ogram done recently in the [facility] showed arctic insufficiency described as moderate, mechanism was unclear, ejection fraction was preserved, his left ventricle size was normal.  He does have exertional shortness of breath but is difficult to judge if this is related to heart almost like is related to emphysema related to chronic smoking.  Denies have any chest pain tightness squeezing pressure burning chest.  There was some notification in the chart that he does have calcification of the coronary arteries but I was not able to retrieve CT that demonstrated this.  He is ability to exercise is limited because of back pain as well as because of leg pain and left leg appears to be worse than the right while walking.  He thinks it is related to his back it may be right but of course concerns about potential peripheral vascular disease.  Smokes about 1-1/2 pack/day has been doing this for many years.  He tried to quit few times but obviously never succeeded.  Does have family history of coronary artery disease but not premature.   Not on any special diet does not exercise on the regular basis he does have history of thalamic stroke.  Past Medical History:  Diagnosis Date   Aortic atherosclerosis (HCC)    Benign hypertension 08/25/2021   08/25/2021 : initial dx/rx     Centrilobular emphysema (HCC) 01/26/2016   08/31/2021: on CT - mild centrilobular and paraseptal emphysema     Claudication of both lower extremities (HCC)    Coronary artery calcification    Current every day smoker 04/16/2016   2017: cessation atttempt     Mixed hyperlipidemia 01/26/2016   08/20/2021: initial statin     Recurrent major depressive disorder, in remission (HCC) 01/26/2016   Thalamic infarction [facility])     Past Surgical History:  Procedure Laterality Date   ANAL FISSURECTOMY  2004   CERVICAL FUSION  2014   ELBOW SURGERY Left 2019   KNEE ARTHROSCOPY W/ MENISCAL REPAIR     LUMBAR LAMINECTOMY     TONSILLECTOMY AND ADENOIDECTOMY      Current Medications: Current Meds  Medication Sig   albuterol (VENTOLIN HFA) 108 (90 Base) MCG/ACT inhaler Inhale 2 puffs into the lungs every 6 (six) hours as needed for wheezing or shortness of breath.   amLODipine (NORVASC) 5 MG tablet Take 5 mg by mouth daily.   atorvastatin (LIPITOR) 40 MG tablet Take 40 mg by mouth daily.   buPROPion (WELLBUTRIN XL) 150 MG 24 hr tablet  Take 150 mg by mouth daily.   clopidogrel (PLAVIX) 75 MG tablet Take 75 mg by mouth daily.   cyclobenzaprine (FLEXERIL) 5 MG tablet Take 5 mg by mouth 3 (three) times daily as needed for muscle spasms.   doxazosin (CARDURA) 2 MG tablet Take 1 mg by mouth at bedtime.   gabapentin (NEURONTIN) 300 MG capsule Take 300 mg by mouth 2 (two) times daily.   meloxicam (MOBIC) 15 MG tablet Take 15 mg by mouth daily.   PARoxetine (PAXIL) 40 MG tablet Take 40 mg by mouth daily.   tamsulosin (FLOMAX) 0.4 MG CAPS capsule Take 0.4 mg by mouth daily.   Tiotropium Bromide-Olodaterol (STIOLTO RESPIMAT) 2.5-2.5 MCG/ACT AERS Inhale 2 puffs into the  lungs daily.   traZODone (DESYREL) 100 MG tablet Take 100 mg by mouth at bedtime as needed for sleep.     Allergies:   Aspirin, Tizanidine, Codeine, and Penicillins   Social History   Socioeconomic History   Marital status: Married    Spouse name: Not on file   Number of children: Not on file   Years of education: Not on file   Highest education level: Not on file  Occupational History   Not on file  Tobacco Use   Smoking status: Every Day    Types: Cigarettes   Smokeless tobacco: Never  Substance and Sexual Activity   Alcohol use: Not on file   Drug use: Not on file   Sexual activity: Not on file  Other Topics Concern   Not on file  Social History Narrative   Not on file   Social Drivers of Health   Financial Resource Strain: Low Risk  (01/05/2024)   Received from [facility]   Overall Financial Resource Strain (CARDIA)    Difficulty of Paying Living Expenses: Not hard at all  Food Insecurity: Low Risk  (03/01/2024)   Received from [facility]   Hunger Vital Sign    Within the past 12 months, you worried that your food would run out before you got money to buy more: Never true    Within the past 12 months, the food you bought just didn't last and you didn't have money to get more. : Never true  Transportation Needs: No Transportation Needs (03/01/2024)   Received from [facility]    In the past 12 months, has lack of reliable transportation kept you from medical appointments, meetings, work or from getting things needed for daily living? : No  Physical Activity: Not on file  Stress: Not on file  Social Connections: Not on file     Family History: The patient's family history includes Cancer in his brother; Hypertension in his mother; Melanoma in his father; Stroke in his mother. There is no history of Diabetes. ROS:   Please see the history of present illness.    All 14 point review of systems negative except as described per history of  present illness.  EKGs/Labs/Other Studies Reviewed:    The following studies were reviewed today:   EKG:  EKG Interpretation Date/Time:  Tuesday March 20 2024 08:59:17 EDT Ventricular Rate:  92 PR Interval:  148 QRS Duration:  128 QT Interval:  360 QTC Calculation: 445 R Axis:   79  Text Interpretation: Normal sinus rhythm Right bundle branch block When compared with ECG of 30-Oct-1998 04:14, Right bundle branch block is now Present Confirmed by Bernie Charleston [phone]) on 03/20/2024 9:18:12 AM    Recent Labs: No results found for  requested labs within last 365 days.  Recent Lipid Panel No results found for: CHOL, TRIG, HDL, CHOLHDL, VLDL, LDLCALC, LDLDIRECT  Physical Exam:    VS:  BP (!) 150/96 (BP Location: Left Arm, Patient Position: Sitting, Cuff Size: Normal)   Pulse 92   Ht 5' 11 (1.803 m)   Wt 168 lb (76.2 kg)   SpO2 94%   BMI 23.43 kg/m     Wt Readings from Last 3 Encounters:  03/20/24 168 lb (76.2 kg)     GEN:  Well nourished, well developed in no acute distress HEENT: Normal NECK: No JVD; No carotid bruits LYMPHATICS: No lymphadenopathy CARDIAC: RRR, no murmurs, no rubs, no gallops RESPIRATORY:  Clear to auscultation without rales, wheezing or rhonchi  ABDOMEN: Soft, non-tender, non-distended MUSCULOSKELETAL:  No edema; No deformity  SKIN: Warm and dry NEUROLOGIC:  Alert and oriented x 3 PSYCHIATRIC:  Normal affect   ASSESSMENT:    1. Coronary artery calcification   2. Benign hypertension   3. Mixed hyperlipidemia   4. Aortic atherosclerosis (HCC)   5. Nonrheumatic aortic valve insufficiency   6. Centrilobular emphysema (HCC)   7. Thalamic infarction (HCC)   8. Current every day smoker    PLAN:    In order of problems listed above:  Aortic insufficiency: Looking at the echocardiogram only moderate, with normal left ventricular function, normal left ventricle size.  Continue monitoring.  The key will be to control his blood  pressure better. Arthrosclerosis diffuse related to chronic smoking as well as dyslipidemia.  Will schedule him to have coronary CT angio to look at his coronary arteries.  I will also schedule him to have carotic ultrasounds as well as duplex evaluation of lower extremities.  In the meantime continue antiplatelet therapy, he is intolerant to aspirin he takes Plavix which is absolutely fine. Essential hypertension first visit in my office blood pressure elevated we will continue monitoring adjust medication if needed. Smoking obviously huge problem we had a long discussion about this and I strongly recommend to quit he said he will work on that. History of thalamic stroke.  Noted.  He does usually coronary type of stroke but will continue CAD workup as well as assessment of atherosclerosis in the major vessel. Dyslipidemia Managed by primary care physician.   Medication Adjustments/Labs and Tests Ordered: Current medicines are reviewed at length with the patient today.  Concerns regarding medicines are outlined above.  Orders Placed This Encounter  Procedures   EKG 12-Lead   No orders of the defined types were placed in this encounter.   Signed, Shane DOROTHA Fitch, MD, [facility]. 03/20/2024 9:43 AM    [facility]

## 2024-03-20 NOTE — Patient Instructions (Addendum)
 Medication Instructions:   TAKE: Metoprolol  100mg  1 tablet 2 hours prior to CT scan   Lab Work: None Ordered If you have labs (blood work) drawn today and your tests are completely normal, you will receive your results only by: MyChart Message (if you have MyChart) OR A paper copy in the mail If you have any lab test that is abnormal or we need to change your treatment, we will call you to review the results.   Testing/Procedures:  Your physician has requested that you have a carotid duplex. This test is an ultrasound of the carotid arteries in your neck. It looks at blood flow through these arteries that supply the brain with blood. Allow one hour for this exam. There are no restrictions or special instructions.   Your physician has requested that you have a lower or upper extremity arterial duplex. This test is an ultrasound of the arteries in the legs or arms. It looks at arterial blood flow in the legs and arms. Allow one hour for Lower and Upper Arterial scans. There are no restrictions or special instructions.  Your physician has requested that you have an ankle brachial index (ABI). During this test an ultrasound and blood pressure cuff are used to evaluate the arteries that supply the arms and legs with blood. Allow thirty minutes for this exam. There are no restrictions or special instructions.  Please note: We ask at that you not bring children with you during ultrasound (echo/ vascular) testing. Due to room size and safety concerns, children are not allowed in the ultrasound rooms during exams. Our front office staff cannot provide observation of children in our lobby area while testing is being conducted. An adult accompanying a patient to their appointment will only be allowed in the ultrasound room at the discretion of the ultrasound technician under special circumstances. We apologize for any inconvenience.   Your cardiac CT will be scheduled at one of the below locations:    [facility] [address] [place], [place] [address] [phone]  Please follow these instructions carefully (unless otherwise directed):  Hold all erectile dysfunction medications at least 3 days (72 hrs) prior to test.  On the Night Before the Test: Be sure to Drink plenty of water. Do not consume any caffeinated/decaffeinated beverages or chocolate 12 hours prior to your test. Do not take any antihistamines 12 hours prior to your test.   On the Day of the Test: Drink plenty of water until 1 hour prior to the test. Do not eat any food 4 hours prior to the test. You may take your regular medications prior to the test.  Take metoprolol  (Lopressor ) two hours prior to test.         After the Test: Drink plenty of water. After receiving IV contrast, you may experience a mild flushed feeling. This is normal. On occasion, you may experience a mild rash up to 24 hours after the test. This is not dangerous. If this occurs, you can take Benadryl 25 mg and increase your fluid intake. If you experience trouble breathing, this can be serious. If it is severe call 911 IMMEDIATELY. If it is mild, please call our office. If you take any of these medications: Glipizide/Metformin, Avandament, Glucavance, please do not take 48 hours after completing test unless otherwise instructed.  We will call to schedule your test 2-4 weeks out understanding that some insurance companies will need an authorization prior to the service being performed.   For non-scheduling related questions,  please contact the cardiac imaging nurse navigator should you have any questions/concerns: Camie Shutter, Cardiac Imaging Nurse Navigator Chantal Requena, Cardiac Imaging Nurse Navigator [facility] Direct Office Dial: [phone]   For scheduling needs, including cancellations and rescheduling, please call [place], [phone].    Follow-Up: At [facility], you and your  health needs are our priority.  As part of our continuing mission to provide you with exceptional heart care, we have created designated Provider Care Teams.  These Care Teams include your primary Cardiologist (physician) and Advanced Practice Providers (APPs -  Physician Assistants and Nurse Practitioners) who all work together to provide you with the care you need, when you need it.  We recommend signing up for the patient portal called MyChart.  Sign up information is provided on this After Visit Summary.  MyChart is used to connect with patients for Virtual Visits (Telemedicine).  Patients are able to view lab/test results, encounter notes, upcoming appointments, etc.  Non-urgent messages can be sent to your provider as well.   To learn more about what you can do with MyChart, go to [email or web address].    Your next appointment:   3 month(s)  The format for your next appointment:   In Person  Provider:   Lamar Fitch, MD    Other Instructions NA

## 2024-03-30 DIAGNOSIS — M48062 Spinal stenosis, lumbar region with neurogenic claudication: Secondary | ICD-10-CM | POA: Insufficient documentation

## 2024-04-05 ENCOUNTER — Encounter (HOSPITAL_COMMUNITY): Payer: Self-pay

## 2024-04-11 ENCOUNTER — Ambulatory Visit: Attending: Cardiology

## 2024-04-11 DIAGNOSIS — I2510 Atherosclerotic heart disease of native coronary artery without angina pectoris: Secondary | ICD-10-CM

## 2024-04-11 DIAGNOSIS — I6523 Occlusion and stenosis of bilateral carotid arteries: Secondary | ICD-10-CM | POA: Diagnosis not present

## 2024-04-12 ENCOUNTER — Ambulatory Visit: Payer: Self-pay | Admitting: Cardiology

## 2024-04-13 ENCOUNTER — Ambulatory Visit: Attending: Cardiology

## 2024-04-13 ENCOUNTER — Ambulatory Visit

## 2024-04-13 DIAGNOSIS — I739 Peripheral vascular disease, unspecified: Secondary | ICD-10-CM | POA: Diagnosis not present

## 2024-04-13 LAB — VAS US ABI WITH/WO TBI
Left ABI: 1.22
Right ABI: 1.23

## 2024-04-23 ENCOUNTER — Telehealth (HOSPITAL_COMMUNITY): Payer: Self-pay | Admitting: *Deleted

## 2024-04-23 NOTE — Telephone Encounter (Signed)
 Reaching out to patient to offer assistance regarding upcoming cardiac imaging study; pt verbalizes understanding of appt date/time, parking situation and where to check in, pre-test NPO status and medications ordered, and verified current allergies; name and call back number provided for further questions should they arise Shane Seats RN Navigator Cardiac Imaging [facility] [phone] office [phone] cell

## 2024-04-24 ENCOUNTER — Ambulatory Visit (HOSPITAL_BASED_OUTPATIENT_CLINIC_OR_DEPARTMENT_OTHER)
Admission: RE | Admit: 2024-04-24 | Discharge: 2024-04-24 | Disposition: A | Discharge status: Home / Self Care | Source: Ambulatory Visit | Attending: Cardiology | Admitting: Cardiology

## 2024-04-24 DIAGNOSIS — R072 Precordial pain: Secondary | ICD-10-CM | POA: Diagnosis present

## 2024-04-24 MED ORDER — IOHEXOL 350 MG/ML SOLN
100.0000 mL | Freq: Once | INTRAVENOUS | Status: AC | PRN
  Administered 2024-04-24: 95 mL via INTRAVENOUS

## 2024-04-24 MED ORDER — NITROGLYCERIN 0.4 MG SL SUBL
0.8000 mg | SUBLINGUAL_TABLET | Freq: Once | SUBLINGUAL | Status: AC
  Administered 2024-04-24: 0.8 mg via SUBLINGUAL

## 2024-04-26 NOTE — Telephone Encounter (Signed)
-----   Message from Lamar Fitch sent at 04/26/2024  9:16 AM EDT ----- Coronary CTA did not show high calcium, however likely only mild stenosis.  Details during the visit ----- Message ----- From: Interface, Rad Results In Sent: 04/24/2024  12:31 PM EDT To: Lamar JINNY Fitch, MD

## 2024-04-27 ENCOUNTER — Telehealth: Payer: Self-pay | Admitting: Cardiology

## 2024-04-27 NOTE — Telephone Encounter (Signed)
 Patient is returning call.

## 2024-04-27 NOTE — Telephone Encounter (Signed)
  Rocky Sor, PA would like to speak with provider regarding recommendations for patient.   Does pt need anymore testing before he has lumbar spine surgery?  Can return her call to her cell [phone]

## 2024-04-27 NOTE — Telephone Encounter (Signed)
 Spoke with Shane Mcguire who requested the result note from Cardiac CT as they are clearing pt for upcoming surgery.   Coronary CTA did not show high calcium, however likely only mild stenosis.   Shane Mcguire states they will have pt start taking aspirin daily for 1 week and hold Plavix.  Please call if you do not agree with their plan.

## 2024-06-19 ENCOUNTER — Encounter: Payer: Self-pay | Admitting: *Deleted

## 2024-06-20 ENCOUNTER — Ambulatory Visit: Attending: Cardiology | Admitting: Cardiology

## 2024-06-20 ENCOUNTER — Encounter: Payer: Self-pay | Admitting: Cardiology

## 2024-06-20 VITALS — BP 140/90 | HR 86 | Ht 71.0 in | Wt 167.0 lb

## 2024-06-20 DIAGNOSIS — I6523 Occlusion and stenosis of bilateral carotid arteries: Secondary | ICD-10-CM

## 2024-06-20 DIAGNOSIS — I351 Nonrheumatic aortic (valve) insufficiency: Secondary | ICD-10-CM

## 2024-06-20 DIAGNOSIS — I10 Essential (primary) hypertension: Secondary | ICD-10-CM

## 2024-06-20 DIAGNOSIS — I2510 Atherosclerotic heart disease of native coronary artery without angina pectoris: Secondary | ICD-10-CM | POA: Diagnosis not present

## 2024-06-20 DIAGNOSIS — E782 Mixed hyperlipidemia: Secondary | ICD-10-CM

## 2024-06-20 DIAGNOSIS — I779 Disorder of arteries and arterioles, unspecified: Secondary | ICD-10-CM | POA: Insufficient documentation

## 2024-06-20 MED ORDER — AMLODIPINE BESYLATE 10 MG PO TABS: 10.0000 mg | ORAL_TABLET | Freq: Every day | ORAL | 3 refills | Status: AC

## 2024-06-20 NOTE — Patient Instructions (Addendum)
 Medication Instructions:   INCREASE: Amlodipine 10mg  1 tablet daily   Lab Work: None Ordered If you have labs (blood work) drawn today and your tests are completely normal, you will receive your results only by: MyChart Message (if you have MyChart) OR A paper copy in the mail If you have any lab test that is abnormal or we need to change your treatment, we will call you to review the results.   Testing/Procedures: None Ordered   Follow-Up: At [facility], you and your health needs are our priority.  As part of our continuing mission to provide you with exceptional heart care, we have created designated Provider Care Teams.  These Care Teams include your primary Cardiologist (physician) and Advanced Practice Providers (APPs -  Physician Assistants and Nurse Practitioners) who all work together to provide you with the care you need, when you need it.  We recommend signing up for the patient portal called MyChart.  Sign up information is provided on this After Visit Summary.  MyChart is used to connect with patients for Virtual Visits (Telemedicine).  Patients are able to view lab/test results, encounter notes, upcoming appointments, etc.  Non-urgent messages can be sent to your provider as well.   To learn more about what you can do with MyChart, go to [email or web address].    Your next appointment:   6 month(s)  The format for your next appointment:   In Person  Provider:   Lamar Fitch, MD    Other Instructions NA

## 2024-06-20 NOTE — Progress Notes (Signed)
 Cardiology Office Note:    Date:  06/20/2024   ID:  Shane Mcguire, DOB 06/21/1956, MRN [id number]  PCP:  [name], MD  Cardiologist:  Lamar Fitch, MD    Referring MD: No ref. provider found   Chief Complaint  Patient presents with   Follow-up  Doing fine  History of Present Illness:    Shane Mcguire is a 68 y.o. male past medical history significant for aortic insufficiency which is moderate, essential hypertension, coronary disease with high calcium score of 1605 which is 92nd percentile however only mild nonobstructive disease of mid LAD and proximal LAD has been identified, also peripheral vascular disease with up to 39% stenosis of internal carotic artery bilaterally, ABIs were normal.  He was seen by me last time before his spinal surgery.  He did not went to surgery with no difficulties, feeling much better less pain still a bit stiff there but overall recovering quite nicely.  Denies of any chest pain tightness squeezing pressure in chest.  Sadly still continues to smoke  Past Medical History:  Diagnosis Date   Aortic atherosclerosis    Benign hypertension 08/25/2021   08/25/2021 : initial dx/rx     Centrilobular emphysema (HCC) 01/26/2016   08/31/2021: on CT - mild centrilobular and paraseptal emphysema     Claudication of both lower extremities    Coronary artery calcification    Current every day smoker 04/16/2016   2017: cessation atttempt     Mixed hyperlipidemia 01/26/2016   08/20/2021: initial statin     Recurrent major depressive disorder, in remission 01/26/2016   Thalamic infarction [facility])     Past Surgical History:  Procedure Laterality Date   ANAL FISSURECTOMY  2004   CERVICAL FUSION  2014   ELBOW SURGERY Left 2019   KNEE ARTHROSCOPY W/ MENISCAL REPAIR     LUMBAR LAMINECTOMY     TONSILLECTOMY AND ADENOIDECTOMY      Current Medications: Current Meds  Medication Sig   albuterol (VENTOLIN HFA) 108 (90 Base) MCG/ACT inhaler Inhale 2 puffs  into the lungs every 6 (six) hours as needed for wheezing or shortness of breath.   amLODipine (NORVASC) 5 MG tablet Take 5 mg by mouth daily.   atorvastatin (LIPITOR) 40 MG tablet Take 40 mg by mouth daily.   clopidogrel (PLAVIX) 75 MG tablet Take 75 mg by mouth daily.   cyclobenzaprine (FLEXERIL) 5 MG tablet Take 5 mg by mouth 3 (three) times daily as needed for muscle spasms.   doxazosin (CARDURA) 2 MG tablet Take 1 mg by mouth at bedtime.   DULoxetine (CYMBALTA) 60 MG capsule Take 60 mg by mouth daily.   gabapentin (NEURONTIN) 300 MG capsule Take 300 mg by mouth 2 (two) times daily.   HYDROcodone-acetaminophen (NORCO) 10-325 MG tablet Take 1 tablet by mouth every 6 (six) hours as needed for moderate pain (pain score 4-6).   meloxicam (MOBIC) 15 MG tablet Take 15 mg by mouth daily.   metoprolol  tartrate (LOPRESSOR ) 100 MG tablet Take one tablet 2 hours before cardiac CT for heart greater than 55   PARoxetine (PAXIL) 40 MG tablet Take 40 mg by mouth daily.   tamsulosin (FLOMAX) 0.4 MG CAPS capsule Take 0.4 mg by mouth daily.   Tiotropium Bromide-Olodaterol (STIOLTO RESPIMAT) 2.5-2.5 MCG/ACT AERS Inhale 2 puffs into the lungs daily.   traZODone (DESYREL) 100 MG tablet Take 100 mg by mouth at bedtime as needed for sleep.     Allergies:   Aspirin, Tizanidine,  Codeine, and Penicillins   Social History   Socioeconomic History   Marital status: Married    Spouse name: Not on file   Number of children: Not on file   Years of education: Not on file   Highest education level: Not on file  Occupational History   Not on file  Tobacco Use   Smoking status: Every Day    Types: Cigarettes   Smokeless tobacco: Never  Substance and Sexual Activity   Alcohol use: Not on file   Drug use: Not on file   Sexual activity: Not on file  Other Topics Concern   Not on file  Social History Narrative   Not on file   Social Drivers of Health   Financial Resource Strain: Low Risk  (01/05/2024)    [facility]   Overall Financial Resource Strain (CARDIA)    Difficulty of Paying Living Expenses: Not hard at all  Food Insecurity: No Food Insecurity (05/07/2024)   Received from [facility]   Hunger Vital Sign    Within the past 12 months, you worried that your food would run out before you got the money to buy more.: Never true    Within the past 12 months, the food you bought just didn't last and you didn't have money to get more.: Never true  Transportation Needs: No Transportation Needs (05/08/2024)   Received from [facility] - Transportation    In the past 12 months, has lack of transportation kept you from medical appointments or from getting medications?: No    In the past 12 months, has lack of transportation kept you from meetings, work, or from getting things needed for daily living?: No  Physical Activity: Not on file  Stress: No Stress Concern Present (05/07/2024)   Received from [facility] - Occupational Stress Questionnaire    Do you feel stress - tense, restless, nervous, or anxious, or unable to sleep at night because your mind is troubled all the time - these days?: Not at all  Social Connections: Not on file     Family History: The patient's family history includes Cancer in his brother; Hypertension in his mother; Melanoma in his father; Stroke in his mother. There is no history of Diabetes. ROS:   Please see the history of present illness.    All 14 point review of systems negative except as described per history of present illness  EKGs/Labs/Other Studies Reviewed:         Recent Labs: No results found for requested labs within last 365 days.  Recent Lipid Panel No results found for: CHOL, TRIG, HDL, CHOLHDL, VLDL, LDLCALC, LDLDIRECT  Physical Exam:    VS:  BP (!) 140/90   Pulse 86   Ht 5' 11 (1.803 m)   Wt 167 lb (75.8 kg)   SpO2 99%   BMI 23.29 kg/m     Wt  Readings from Last 3 Encounters:  06/20/24 167 lb (75.8 kg)  03/20/24 168 lb (76.2 kg)     GEN:  Well nourished, well developed in no acute distress HEENT: Normal NECK: No JVD; No carotid bruits LYMPHATICS: No lymphadenopathy CARDIAC: RRR, no murmurs, no rubs, no gallops RESPIRATORY:  Clear to auscultation without rales, wheezing or rhonchi  ABDOMEN: Soft, non-tender, non-distended MUSCULOSKELETAL:  No edema; No deformity  SKIN: Warm and dry LOWER EXTREMITIES: no swelling NEUROLOGIC:  Alert and oriented x 3 PSYCHIATRIC:  Normal affect  ASSESSMENT:    1. Nonrheumatic aortic valve insufficiency   2. Benign hypertension   3. Bilateral carotid artery stenosis   4. Coronary artery disease involving native coronary artery of native heart without angina pectoris   5. Mixed hyperlipidemia    PLAN:    In order of problems listed above:  Coronary disease likely not critical.  On antiplatelet therapy which I continue to every other day risk factors modifications. Essential hypertension, blood pressure elevated 1 4/90 I will ask him to double the dose of amlodipine, I warned him of potential side effect which include swelling of lower extremities and ask him to let me know if it bothers him enough. Bilateral carotic arterial stenosis only mild up to 39% stenosis risk modifications. Pain in his lower extremities ABI were normal. Smoking obviously huge problems.  Real deal of time talking about this I strongly recommend to quit.  I told him he quitting smoking will reduce chances for him to have a heart problem by 50%. Dyslipidemia he is on high intense statin which I will continue.   Medication Adjustments/Labs and Tests Ordered: Current medicines are reviewed at length with the patient today.  Concerns regarding medicines are outlined above.  No orders of the defined types were placed in this encounter.  Medication changes: No orders of the defined types were placed in this  encounter.   Signed, Lamar DOROTHA Fitch, MD, [facility] 06/20/2024 10:46 AM    [facility]
